# Patient Record
Sex: Male | Born: 1990 | Race: Black or African American | Hispanic: No | Marital: Single
Health system: Southern US, Community
[De-identification: ages and names within clinical notes are randomized; demographics above are authoritative.]

---

## 2019-05-05 ENCOUNTER — Emergency Department (HOSPITAL_COMMUNITY)
Admission: EM | Admit: 2019-05-05 | Discharge: 2019-05-06 | Disposition: A | Discharge status: Home / Self Care | Payer: PRIVATE HEALTH INSURANCE | Attending: Emergency Medicine | Admitting: Emergency Medicine

## 2019-05-05 ENCOUNTER — Encounter (HOSPITAL_COMMUNITY): Payer: Self-pay

## 2019-05-05 ENCOUNTER — Emergency Department (HOSPITAL_COMMUNITY): Payer: PRIVATE HEALTH INSURANCE

## 2019-05-05 ENCOUNTER — Other Ambulatory Visit: Payer: Self-pay

## 2019-05-05 DIAGNOSIS — M7918 Myalgia, other site: Secondary | ICD-10-CM

## 2019-05-05 DIAGNOSIS — Y92410 Unspecified street and highway as the place of occurrence of the external cause: Secondary | ICD-10-CM | POA: Insufficient documentation

## 2019-05-05 DIAGNOSIS — M542 Cervicalgia: Secondary | ICD-10-CM | POA: Insufficient documentation

## 2019-05-05 DIAGNOSIS — M546 Pain in thoracic spine: Secondary | ICD-10-CM | POA: Insufficient documentation

## 2019-05-05 DIAGNOSIS — M25561 Pain in right knee: Secondary | ICD-10-CM | POA: Diagnosis not present

## 2019-05-05 DIAGNOSIS — Y9389 Activity, other specified: Secondary | ICD-10-CM | POA: Diagnosis not present

## 2019-05-05 DIAGNOSIS — Y999 Unspecified external cause status: Secondary | ICD-10-CM | POA: Insufficient documentation

## 2019-05-05 LAB — I-STAT BETA HCG BLOOD, ED (MC, WL, AP ONLY): I-stat hCG, quantitative: <5 m[IU]/mL (ref <5)

## 2019-05-05 MED ORDER — OXYCODONE-ACETAMINOPHEN 5-325 MG PO TABS
1.0000 | ORAL_TABLET | ORAL | Status: DC | PRN
  Administered 2019-05-05: 1 via ORAL
  Filled 2019-05-05: qty 1
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
## 2019-05-06 ENCOUNTER — Emergency Department (HOSPITAL_COMMUNITY): Payer: PRIVATE HEALTH INSURANCE

## 2019-05-06 MED ORDER — HYDROMORPHONE HCL 1 MG/ML IJ SOLN
1.0000 mg | Freq: Once | INTRAMUSCULAR | Status: AC
  Administered 2019-05-06: 03:00:00 1 mg via INTRAMUSCULAR
  Filled 2019-05-06: qty 1

## 2019-05-06 MED ORDER — CYCLOBENZAPRINE HCL 10 MG PO TABS: 10.0000 mg | ORAL_TABLET | Freq: Two times a day (BID) | ORAL | 0 refills | Status: AC | PRN

## 2019-05-06 MED ORDER — MELOXICAM 7.5 MG PO TABS: 7.5000 mg | ORAL_TABLET | Freq: Every day | ORAL | 0 refills | Status: AC
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
## 2019-05-06 NOTE — ED Notes (Signed)
Patient transported to CT/XRAY 

## 2019-05-16 ENCOUNTER — Encounter: Payer: Self-pay | Admitting: Orthopaedic Surgery

## 2019-05-16 ENCOUNTER — Ambulatory Visit (INDEPENDENT_AMBULATORY_CARE_PROVIDER_SITE_OTHER): Payer: PRIVATE HEALTH INSURANCE | Admitting: Orthopaedic Surgery

## 2019-05-16 VITALS — Ht 65.0 in | Wt 154.0 lb

## 2019-05-16 DIAGNOSIS — S161XXA Strain of muscle, fascia and tendon at neck level, initial encounter: Secondary | ICD-10-CM

## 2019-05-16 DIAGNOSIS — S8001XA Contusion of right knee, initial encounter: Secondary | ICD-10-CM

## 2019-05-16 MED ORDER — PREDNISONE 10 MG (21) PO TBPK: ORAL_TABLET | ORAL | 0 refills | Status: AC

## 2019-05-16 MED ORDER — METHOCARBAMOL 500 MG PO TABS: 500.0000 mg | ORAL_TABLET | Freq: Two times a day (BID) | ORAL | 0 refills | Status: AC | PRN
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
## 2019-06-27 ENCOUNTER — Ambulatory Visit: Payer: PRIVATE HEALTH INSURANCE | Admitting: Orthopaedic Surgery
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
